# Patient Record
Sex: Male | Born: 1980 | Race: White | Hispanic: No | Smoking: Never smoker
Health system: Southern US, Community
[De-identification: ages and names within clinical notes are randomized; demographics above are authoritative.]

## PROBLEM LIST (undated history)

## (undated) DIAGNOSIS — G459 Transient cerebral ischemic attack, unspecified: Secondary | ICD-10-CM

## (undated) DIAGNOSIS — I10 Essential (primary) hypertension: Secondary | ICD-10-CM

---

## 2014-05-20 ENCOUNTER — Other Ambulatory Visit: Payer: Self-pay | Admitting: Internal Medicine

## 2014-05-20 DIAGNOSIS — M542 Cervicalgia: Secondary | ICD-10-CM

## 2014-05-29 ENCOUNTER — Ambulatory Visit
Admission: RE | Admit: 2014-05-29 | Discharge: 2014-05-29 | Disposition: A | Discharge status: Home / Self Care | Payer: BC Managed Care – PPO | Source: Ambulatory Visit | Attending: Internal Medicine | Admitting: Internal Medicine

## 2014-05-29 ENCOUNTER — Other Ambulatory Visit: Payer: Self-pay

## 2014-05-29 DIAGNOSIS — M542 Cervicalgia: Secondary | ICD-10-CM

## 2016-12-05 DIAGNOSIS — S161XXA Strain of muscle, fascia and tendon at neck level, initial encounter: Secondary | ICD-10-CM | POA: Diagnosis not present

## 2016-12-05 DIAGNOSIS — G459 Transient cerebral ischemic attack, unspecified: Secondary | ICD-10-CM | POA: Diagnosis not present

## 2016-12-05 DIAGNOSIS — Z8679 Personal history of other diseases of the circulatory system: Secondary | ICD-10-CM

## 2016-12-06 DIAGNOSIS — S161XXA Strain of muscle, fascia and tendon at neck level, initial encounter: Secondary | ICD-10-CM | POA: Diagnosis not present

## 2016-12-06 DIAGNOSIS — G459 Transient cerebral ischemic attack, unspecified: Secondary | ICD-10-CM | POA: Diagnosis not present

## 2016-12-06 DIAGNOSIS — Z8679 Personal history of other diseases of the circulatory system: Secondary | ICD-10-CM | POA: Diagnosis not present

## 2017-09-04 ENCOUNTER — Other Ambulatory Visit: Payer: Self-pay

## 2017-09-04 ENCOUNTER — Encounter (HOSPITAL_COMMUNITY): Payer: Self-pay

## 2017-09-04 ENCOUNTER — Emergency Department (HOSPITAL_COMMUNITY): Payer: Managed Care, Other (non HMO)

## 2017-09-04 ENCOUNTER — Emergency Department (HOSPITAL_COMMUNITY)
Admission: EM | Admit: 2017-09-04 | Discharge: 2017-09-04 | Disposition: A | Discharge status: Home / Self Care | Payer: Managed Care, Other (non HMO) | Attending: Emergency Medicine | Admitting: Emergency Medicine

## 2017-09-04 DIAGNOSIS — R079 Chest pain, unspecified: Secondary | ICD-10-CM | POA: Diagnosis present

## 2017-09-04 DIAGNOSIS — I10 Essential (primary) hypertension: Secondary | ICD-10-CM | POA: Insufficient documentation

## 2017-09-04 DIAGNOSIS — R0789 Other chest pain: Secondary | ICD-10-CM | POA: Diagnosis not present

## 2017-09-04 DIAGNOSIS — Z8673 Personal history of transient ischemic attack (TIA), and cerebral infarction without residual deficits: Secondary | ICD-10-CM | POA: Insufficient documentation

## 2017-09-04 HISTORY — DX: Essential (primary) hypertension: I10

## 2017-09-04 HISTORY — DX: Transient cerebral ischemic attack, unspecified: G45.9

## 2017-09-04 LAB — CBC
HCT: 46.5 % (ref 39.0–52.0)
HEMOGLOBIN: 16.2 g/dL (ref 13.0–17.0)
MCH: 31.1 pg (ref 26.0–34.0)
MCHC: 34.8 g/dL (ref 30.0–36.0)
MCV: 89.3 fL (ref 78.0–100.0)
PLATELETS: 358 10*3/uL (ref 150–400)
RBC: 5.21 MIL/uL (ref 4.22–5.81)
RDW: 12.7 % (ref 11.5–15.5)
WBC: 10.7 10*3/uL — ABNORMAL HIGH (ref 4.0–10.5)

## 2017-09-04 LAB — I-STAT TROPONIN, ED
TROPONIN I, POC: 0.01 ng/mL (ref 0.00–0.08)
Troponin i, poc: 0 ng/mL (ref 0.00–0.08)

## 2017-09-04 LAB — BASIC METABOLIC PANEL
ANION GAP: 10 (ref 5–15)
BUN: 9 mg/dL (ref 6–20)
CALCIUM: 9.2 mg/dL (ref 8.9–10.3)
CO2: 27 mmol/L (ref 22–32)
CREATININE: 1.05 mg/dL (ref 0.61–1.24)
Chloride: 99 mmol/L — ABNORMAL LOW (ref 101–111)
Glucose, Bld: 151 mg/dL — ABNORMAL HIGH (ref 65–99)
Potassium: 3 mmol/L — ABNORMAL LOW (ref 3.5–5.1)
Sodium: 136 mmol/L (ref 135–145)

## 2017-09-04 MED ORDER — MORPHINE SULFATE (PF) 4 MG/ML IV SOLN
4.0000 mg | Freq: Once | INTRAVENOUS | Status: AC
  Administered 2017-09-04: 4 mg via INTRAVENOUS
  Filled 2017-09-04: qty 1

## 2017-09-04 MED ORDER — POTASSIUM CHLORIDE ER 10 MEQ PO TBCR: 20.0000 meq | EXTENDED_RELEASE_TABLET | Freq: Every day | ORAL | 0 refills | Status: AC

## 2017-09-04 MED ORDER — SODIUM CHLORIDE 0.9 % IV BOLUS (SEPSIS)
500.0000 mL | Freq: Once | INTRAVENOUS | Status: AC
  Administered 2017-09-04: 500 mL via INTRAVENOUS

## 2017-09-04 MED ORDER — ONDANSETRON HCL 4 MG/2ML IJ SOLN
4.0000 mg | Freq: Once | INTRAMUSCULAR | Status: AC
  Administered 2017-09-04: 4 mg via INTRAVENOUS
  Filled 2017-09-04: qty 2

## 2017-09-04 NOTE — Discharge Instructions (Signed)
Please follow-up with your doctor and cardiologist for further evaluation and treatment of your chest pain today.  Please return to emergency department if you develop any new or worsening symptoms.
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
## 2017-09-04 NOTE — ED Notes (Signed)
Gave pt ice chips, per Trinna PostAlex - PA.

## 2017-09-04 NOTE — ED Triage Notes (Signed)
Pt endorses getting worked up over family problem and began having chest pain with left arm pain and dizziness. VSS.

## 2019-06-13 IMAGING — DX DG CHEST 2V
2 series · 2 of 2 positions shown · non-contrast
Comparison: None.

CLINICAL DATA: Chest pain

EXAM:
CHEST  2 VIEW

[chest lat]
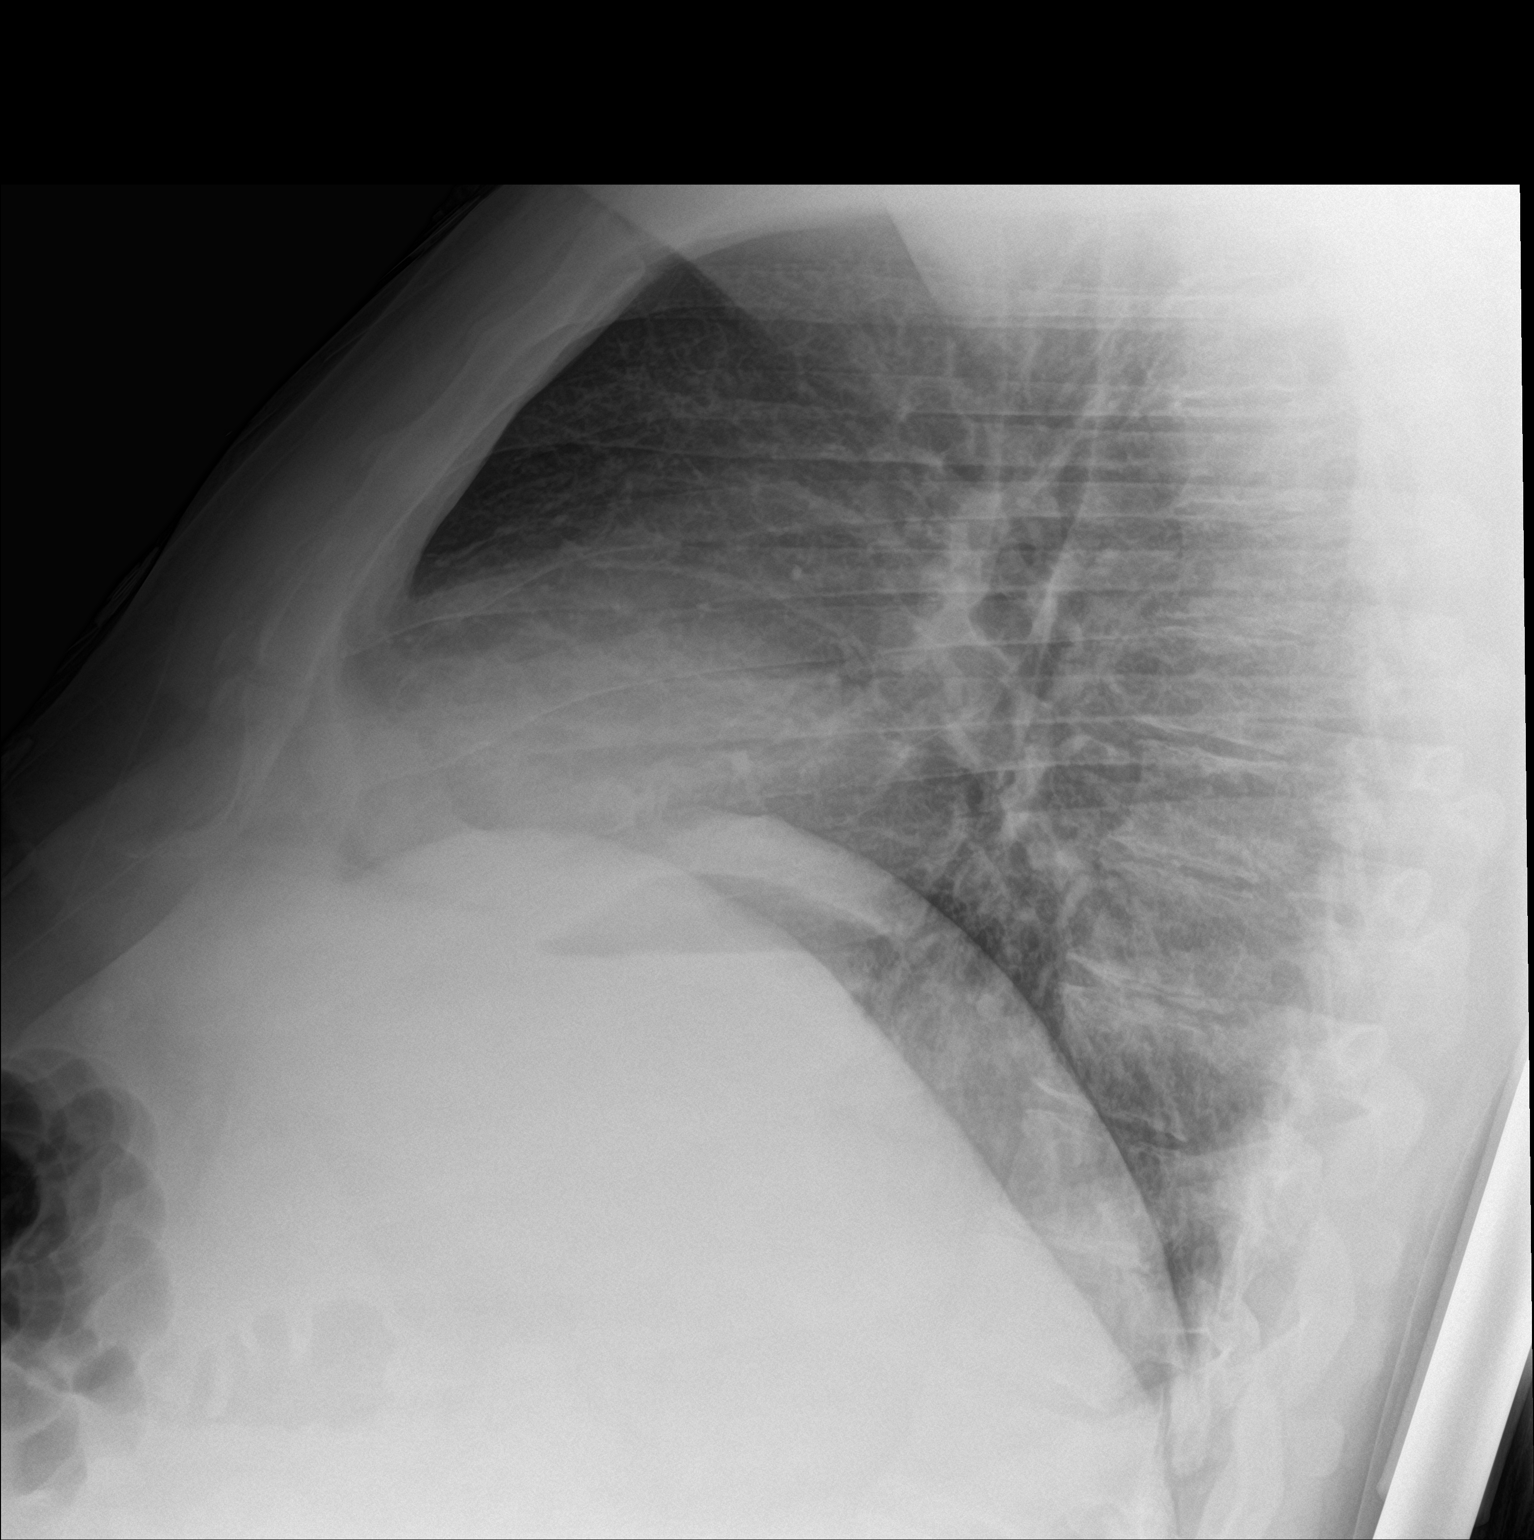

[chest ap]
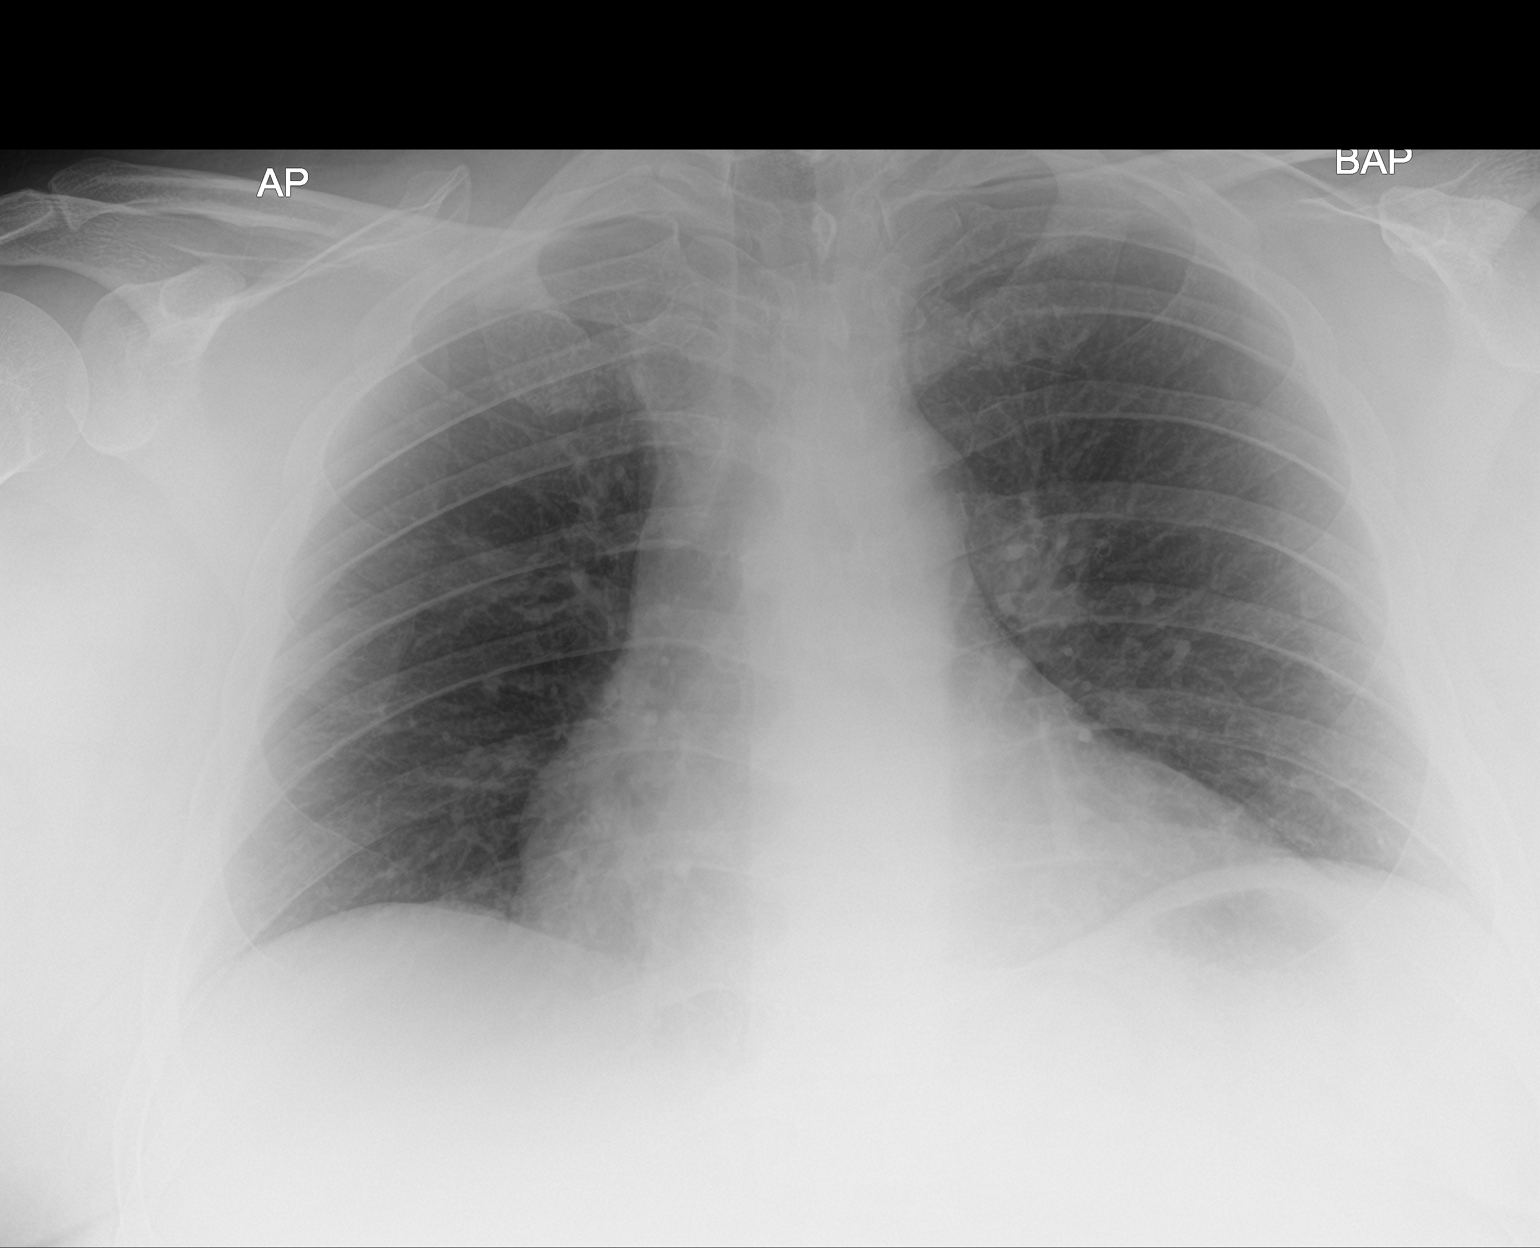

[2 of 2 positions shown; findings below may reference images not displayed]

FINDINGS: Shallow lung inflation and cardiomegaly. No focal airspace
consolidation or pulmonary edema. No pneumothorax or pleural
effusion.
IMPRESSION: Cardiomegaly without pulmonary edema or focal airspace disease.

## 2020-05-21 DEATH — deceased
